# Patient Record
Sex: Male | Born: 1990 | Race: White | Hispanic: No | Marital: Single | Smoking: Never smoker
Health system: Southern US, Community
[De-identification: ages and names within clinical notes are randomized; demographics above are authoritative.]

## PROBLEM LIST (undated history)

## (undated) HISTORY — PX: VASECTOMY: SHX75

---

## 2011-02-11 ENCOUNTER — Inpatient Hospital Stay (HOSPITAL_COMMUNITY)
Admission: EM | Admit: 2011-02-11 | Discharge: 2011-02-14 | DRG: 603 | Disposition: A | Discharge status: Home / Self Care | Payer: PRIVATE HEALTH INSURANCE | Attending: Internal Medicine | Admitting: Internal Medicine

## 2011-02-11 DIAGNOSIS — A4902 Methicillin resistant Staphylococcus aureus infection, unspecified site: Secondary | ICD-10-CM | POA: Diagnosis present

## 2011-02-11 DIAGNOSIS — L02619 Cutaneous abscess of unspecified foot: Principal | ICD-10-CM | POA: Diagnosis present

## 2011-02-11 DIAGNOSIS — J309 Allergic rhinitis, unspecified: Secondary | ICD-10-CM | POA: Diagnosis present

## 2011-02-12 ENCOUNTER — Inpatient Hospital Stay (HOSPITAL_COMMUNITY): Payer: PRIVATE HEALTH INSURANCE

## 2011-02-12 LAB — DIFFERENTIAL
Eosinophils Absolute: 0.1 10*3/uL (ref 0.0–0.7)
Lymphocytes Relative: 46 % (ref 12–46)
Lymphs Abs: 3 10*3/uL (ref 0.7–4.0)
Neutro Abs: 2.7 10*3/uL (ref 1.7–7.7)
Neutrophils Relative %: 42 % — ABNORMAL LOW (ref 43–77)

## 2011-02-12 LAB — BASIC METABOLIC PANEL
CO2: 27 mEq/L (ref 19–32)
Chloride: 100 mEq/L (ref 96–112)
Creatinine, Ser: 0.7 mg/dL (ref 0.4–1.5)

## 2011-02-12 LAB — CBC
MCV: 88.4 fL (ref 78.0–100.0)
Platelets: 245 10*3/uL (ref 150–400)
RBC: 4.98 MIL/uL (ref 4.22–5.81)
WBC: 6.5 10*3/uL (ref 4.0–10.5)

## 2011-02-12 MED ORDER — GADOBENATE DIMEGLUMINE 529 MG/ML IV SOLN: 15.0000 mL | Freq: Once | INTRAVENOUS | Status: AC | PRN

## 2011-02-17 NOTE — Discharge Summary (Signed)
NAMEJACOLBY, [name] NO.:  [id number]  MEDICAL RECORD NO.:  [id number]  LOCATION:  1337                         FACILITY:  [facility]  PHYSICIAN:  Altha Harm, MDDATE OF BIRTH:  1991-05-30  DATE OF ADMISSION:  02/11/2011 DATE OF DISCHARGE:  02/14/2011                              DISCHARGE SUMMARY   DISCHARGE DISPOSITION:  Home.  DISCHARGE DIAGNOSES: 1. Superficial abscess. 2. Methicillin resistant staphylococcus aureus cellulitis. 3. Seasonal allergies.  DISCHARGE MEDICATIONS:  Includes the following: 1. Bactrim DS 2 tablets p.o. b.i.d. x10 days. 2. Vicodin 5/500 one tablet p.o. b.i.d. p.r.n. pain. 3. Loratadine 10 mg p.o. daily. 4. Multivitamin 1 tablet p.o. daily.  CONSULTANT:  Dr. Luretha Murphy, General Surgery.  PROCEDURES:  None.  DIAGNOSTIC STUDIES:  MRI of the left foot which shows superficial soft tissue ulceration with adjacent cellulitis of the anterior aspect of the ankle with no involvement of the underlying tendons, ligaments or osseous structures.  PRIMARY CARE PHYSICIAN:  The patient uses [facility], Dr. Darrin Nipper.  ALLERGIES:  No known drug allergies.  CODE STATUS:  Full code.  CHIEF COMPLAINT:  Swelling of the left foot.  HISTORY OF PRESENT ILLNESS:  Please refer to the H and P by Dr. Nedra Hai for details.  However in short, [name] is a 20 year old gentleman who initially noted a pimple on his leg.  He shaved his leg and then went on a bike ride for 85 miles.  The patient developed cellulitis and was seen at [facility].  At the time of the visit, there was a small abscess which we performed incision and drainage.  The area was cleaned and the patient was discharged with Bactrim and rifampin.  However, the patient's leg worsened over 48 hours and thus the patient presented to the emergency room for further evaluation and treatment.  HOSPITAL COURSE:  Status post I and D abscess with cellulitis of  the left foot.  The patient upon arrival to the emergency room had significant cellulitis and swelling of the foot.  It is clear that oral medications at that time would not penetrate the tissue, thus the patient was started on IV antibiotics with vancomycin and Zosyn. Sensitivities were obtained from the Urgent Care Center which showed that the pathogen was methicillin-resistant staph aureus sensitive to Bactrim among other antibiotics.  The patient received IV antibiotics for 48 hours.  His then was switched over to Bactrim to complete outpatient therapy for total of 10 days.  The patient will follow up with the Urgent Care on Monday for further evaluation and release back to work.  Presently the patient has been instructed to stay off his feet for the next 72 hours allowing the tissue to heel.  He will be reevaluated by Mr. Eloisa Northern at the Urgent Care on Monday and further determination will be made by him going back to work.  The patient has also been given Dr. Molli Hazard Martin's phone number to follow up as needed.  The patient is to do daily wet-to-dry dressing changes q.8 h.  Further instructions about wound care will come from Dr. Luretha Murphy, one of his associates.  PHYSICAL  EXAMINATION:  GENERAL:  At the time of discharge the patient is stable. VITAL SIGNS:  Vital signs are as follows:  Temperature 98, blood pressure 125/62, respiratory rate 20, heart rate 77, O2 sats 100% on room air. HEENT EXAMINATION:  Normocephalic, atraumatic.  Pupils equally round and reactive to light and accommodation.  Extraocular movements are intact. Oropharynx is moist.  No exudate, erythema or lesions are noted. NECK EXAMINATION:  Trachea is midline.  No masses, no thyromegaly, no JVD, no carotid bruit. RESPIRATORY EXAMINATION:  The patient has a normal respiratory effort, equal excursion bilaterally.  No wheezing or rhonchi noted. CARDIOVASCULAR:  He has got a normal S1 and S2.  No murmurs,  rubs, gallops noted. ABDOMEN:  Obese, soft, nontender, nondistended.  No masses, no hepatosplenomegaly. EXTREMITIES:  The lesion on the left foot is about nickel lesion.  The wound bed is red and beefy with good granulation and there is just a very small amount of hydrated necrotic tissue present. PSYCHIATRIC:  He is alert, oriented x3.  Good insight and cognition, recent and remote recall.  FOLLOWUP:  The patient will follow up with [facility] with PA Mr. Eloisa Northern.  The patient also has been given option to follow up with Dr. Daphine Deutscher, the surgeon who saw him in the hospital if he so desires; however, the patient has indicated that he would likely go to the Urgent Care Center since it is close to his house.  In terms of physical restrictions, the patient should be off his feet for the next several days and then limit his time in his feet until the wound starts to scab over but it will be reevaluated on Monday by the PA at the Urgent Care.  Total time for discharge process including face-to-face time approximately 40 minutes.     Altha Harm, MD     MAM/MEDQ  D:  02/14/2011  T:  02/14/2011  Job:  [id number]  cc:   [place] Daphine Deutscher, MD [address] [place] [address]  [place] Sandi Raveling, MD  Electronically Signed by Marthann Schiller MD on 02/17/2011 07:43:51 AM

## 2011-02-28 NOTE — H&P (Signed)
  NAMEELDO, UMANZOR NO.:  [id number]  MEDICAL RECORD NO.:  [id number]  LOCATION:  WLED                         FACILITY:  [facility]  PHYSICIAN:  Houston Siren, MD           DATE OF BIRTH:  03/03/1991  DATE OF ADMISSION:  02/11/2011 DATE OF DISCHARGE:                             HISTORY & PHYSICAL   PRIMARY CARE PHYSICIAN:  None.  ADVANCE DIRECTIVE:  Full code.  REASON FOR ADMISSION:  Left foot cellulitis, failed outpatient therapy.  HISTORY OF PRESENT ILLNESS:  This is a 20 year old [employer] student previously healthy on no chronic medication, presents with cellulitis of the left foot.  A few days ago, he started having a pimple and he tried to drain it with a needle.  He has swelling and pain and subsequently went to the Urgent Care where he had an I and D, and subsequently placed on Bactrim and rifampin.  The swelling progressed and the central necrotic skin has enlarged as well.  He has increased swelling and pain.  He did not recall having any spider bite or any other insect bite. Evaluation in emergency room showed normal white count, and he is afebrile.  Exam shows erythema with swelling with necrotic center. Hospitalist was asked to admit the patient for cellulitis.  PAST MEDICAL HISTORY:  Benign.  MEDICATIONS:  Bactrim and rifampin along with Vicodin for pain.  FAMILY HISTORY:  Noncontributory.  SOCIAL HISTORY:  He denied tobacco, alcohol, or drug use.  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  He is not married and has no children.  ALLERGIES:  No known drug allergies.  PHYSICAL EXAMINATION:  VITAL SIGNS:  Blood pressure 130/80, pulse of 60, respiratory rate 18, temperature 98.6. GENERAL:  Shows alert and oriented, and in no apparent distress. HEENT:  Throat is clear. NECK:  Supple. LUNGS:  Clear. CARDIAC:  With no murmur. ABDOMINAL:  Soft, nontender. EXTREMITIES:  Show swelling and erythema of his left foot with a necrotic center with  ulcers and some necrotic skin, nonfluctuant, and no purulent discharge.  LABORATORY DATA:  White count of 6500, hemoglobin of 15.5, creatinine of 0.7, potassium of 4.1.  IMPRESSION:  This is a 20 year old male previously healthy presented with a pimple that progressed into cellulitis with some necrosis of the center and failed outpatient treatment.  We would treat him with vancomycin and Zosyn.  He might need some superficial debridement.  I would like to give him some pain medication and some IV fluid as well. He is stable and will be admitted to [facility].  If he had not had his tetanus shot, he would need one.     Houston Siren, MD     PL/MEDQ  D:  02/12/2011  T:  02/12/2011  Job:  [id number]  Electronically Signed by Houston Siren  on 02/28/2011 07:23:09 PM

## 2011-03-04 NOTE — Consult Note (Signed)
  NAMEORBIN, MAYEUX NO.:  [id number]  MEDICAL RECORD NO.:  [id number]  LOCATION:  1337                         FACILITY:  [facility]  PHYSICIAN:  Thornton Park. Daphine Deutscher, MD  DATE OF BIRTH:  Apr 13, 1991  DATE OF CONSULTATION:  02/12/2011 DATE OF DISCHARGE:                                CONSULTATION   CHIEF COMPLAINT:  MRSA, open sore on the ventral aspect of the left ankle.  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  He apparently had pimple formed down there and he picked at it last week and then road over the weekend getting area very sweaty and had some real long race or ride.  It then became infected and he was seen at [facility] [address] where they drained it and cultured it and it grew back MRSA.  However, it has gotten to be where it is swollen, red and he came in for IV antibiotics.  Since his admission, his swelling has gone down.  Exam reveals a resultant defect about the size of half dollar.  It has some epidermolysis on the top and some frank purulence draining out. The surrounding areas are not elevated but the edema has largely gone down.  It seems to be adequately drained at the present time and I am going to try Betadine sort of wet to dry gauze dressing to see if this will help to debride this.  If this is unsuccessful, we may need to do more debridement.  Encouragingly he is able to flex and extend his foot and dorsiflex and plantar flex without any kind of limitation or pain. He does report swelling is way down.  He will continue antibiotic therapy IV.  Dr. Ashley Royalty has already ordered MRS.  We can make sure and assess the depth of this infection.  I will follow him with MRS in the hospital.  IMPRESSION:  Methicillin resistant staphylococcus aureus abscess of left lower extremity complicating picking of little minor skin lesion.     Thorn[place] Park Daphine Deutscher,  MD     MBM/MEDQ  D:  02/12/2011  T:  02/12/2011  Job:  [id number]  Electronically Signed by Luretha Murphy MD on 03/04/2011 10:16:44 AM

## 2013-11-20 ENCOUNTER — Emergency Department (INDEPENDENT_AMBULATORY_CARE_PROVIDER_SITE_OTHER)
Admission: EM | Admit: 2013-11-20 | Discharge: 2013-11-20 | Disposition: A | Discharge status: Home / Self Care | Payer: 59 | Source: Home / Self Care | Attending: Emergency Medicine | Admitting: Emergency Medicine

## 2013-11-20 ENCOUNTER — Encounter (HOSPITAL_COMMUNITY): Payer: Self-pay | Admitting: Emergency Medicine

## 2013-11-20 DIAGNOSIS — S61217A Laceration without foreign body of left little finger without damage to nail, initial encounter: Secondary | ICD-10-CM

## 2013-11-20 DIAGNOSIS — S61209A Unspecified open wound of unspecified finger without damage to nail, initial encounter: Secondary | ICD-10-CM

## 2013-11-20 NOTE — Discharge Instructions (Signed)

## 2013-11-20 NOTE — ED Notes (Signed)
Patient states cut left pinky finger today with knife while cutting a onion.

## 2013-11-20 NOTE — ED Provider Notes (Signed)
  Chief Complaint   Chief Complaint  Patient presents with  . Laceration    left "pinky"     History of Present Illness   [name] is a 23 year old male who cut the tip of his left little finger about an hour previously knife while he was cutting some onions. He has a flap laceration the tip of the finger. Bleeding is now controlled. He has good movement and good sensation.  Review of Systems   Other than as noted above, the patient denies any of the following symptoms: Musculoskeletal:  No joint pain or decreased range of motion. Neuro:  No numbness, tingling, or weakness.  PMFSH   Past medical history, family history, social history, meds, and allergies were reviewed. Her last tetanus vaccine was within the past 10 years.  Physical Examination     Vital signs:  BP 143/89  Pulse 85  Temp(Src) 98.7 F (37.1 C) (Oral)  Resp 18  SpO2 100% Ext:  There is a 1 cm flap laceration the tip of the left little finger. Bleeding was controlled. All joints have full range of motion and sensation was intact.  All other joints had a full ROM without pain.  Pulses were full.  Good capillary refill in all digits.  No edema. Neurological:  Alert and oriented.  No muscle weakness.  Sensation was intact to light touch.   Procedure   Verbal informed consent was obtained.  The patient was informed of the risks and benefits of the procedure and understands and accepts.  A time out was called and the identity of the patient and correct procedure were confirmed.   The laceration area described above was prepped with Betadine and saline  and anesthetized with 5 mL of 2% Xylocaine without epinephrine.  The wound was then closed as follows:  The flap was sutured down with 4 5-0 nylon sutures.  There were no immediate complications, and the patient tolerated the procedure well. The laceration was then cleansed, Bacitracin ointment was applied and a clean, dry pressure dressing was put on.   Assessment    The encounter diagnosis was Laceration of left little finger w/o foreign body w/o damage to nail.  Plan   1.  Meds:  The following meds were prescribed:  There are no discharge medications for this patient.   2.  Patient Education/Counseling:  The patient was given appropriate handouts, self care instructions, and instructed in symptomatic relief. Instructions were given for wound care.    3.  Follow up:  The patient was told to follow up immediately if there is any sign of infection.The patient will return in 14 days for suture removal.      Reuben Likesavid C Aarush Stukey, MD 11/20/13 2041

## 2014-02-27 ENCOUNTER — Emergency Department (HOSPITAL_COMMUNITY)
Admission: EM | Admit: 2014-02-27 | Discharge: 2014-02-27 | Discharge status: Absent without leave | Payer: 59 | Source: Home / Self Care

## 2018-10-19 ENCOUNTER — Other Ambulatory Visit: Payer: Self-pay | Admitting: Family Medicine

## 2018-10-19 ENCOUNTER — Ambulatory Visit
Admission: RE | Admit: 2018-10-19 | Discharge: 2018-10-19 | Disposition: A | Discharge status: Home / Self Care | Payer: BLUE CROSS/BLUE SHIELD | Source: Ambulatory Visit | Attending: Family Medicine | Admitting: Family Medicine

## 2018-10-19 DIAGNOSIS — M25561 Pain in right knee: Secondary | ICD-10-CM

## 2019-09-02 DIAGNOSIS — A4902 Methicillin resistant Staphylococcus aureus infection, unspecified site: Secondary | ICD-10-CM

## 2019-09-02 HISTORY — DX: Methicillin resistant Staphylococcus aureus infection, unspecified site: A49.02

## 2019-10-08 IMAGING — CR DG KNEE COMPLETE 4+V*R*
4 series · 4 of 4 positions shown · non-contrast
Comparison: None.

CLINICAL DATA: 27-year-old male with dull tight pain in right knee
when running for the past 2 years. Anterior pain inferior to
patella. No known injury. Initial encounter.

EXAM:
RIGHT KNEE - COMPLETE 4+ VIEW

[w knee ap right]
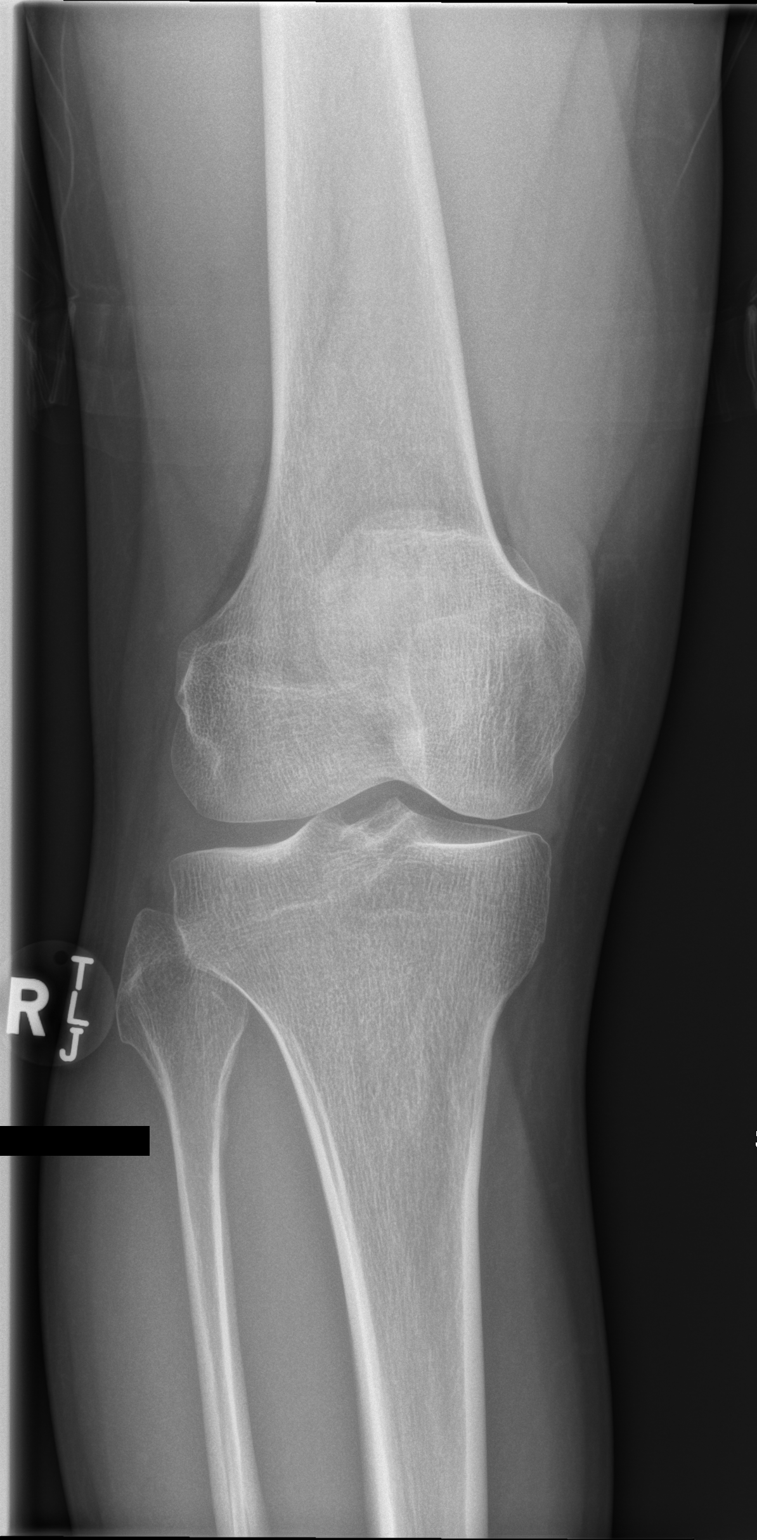

[w knee lat right]
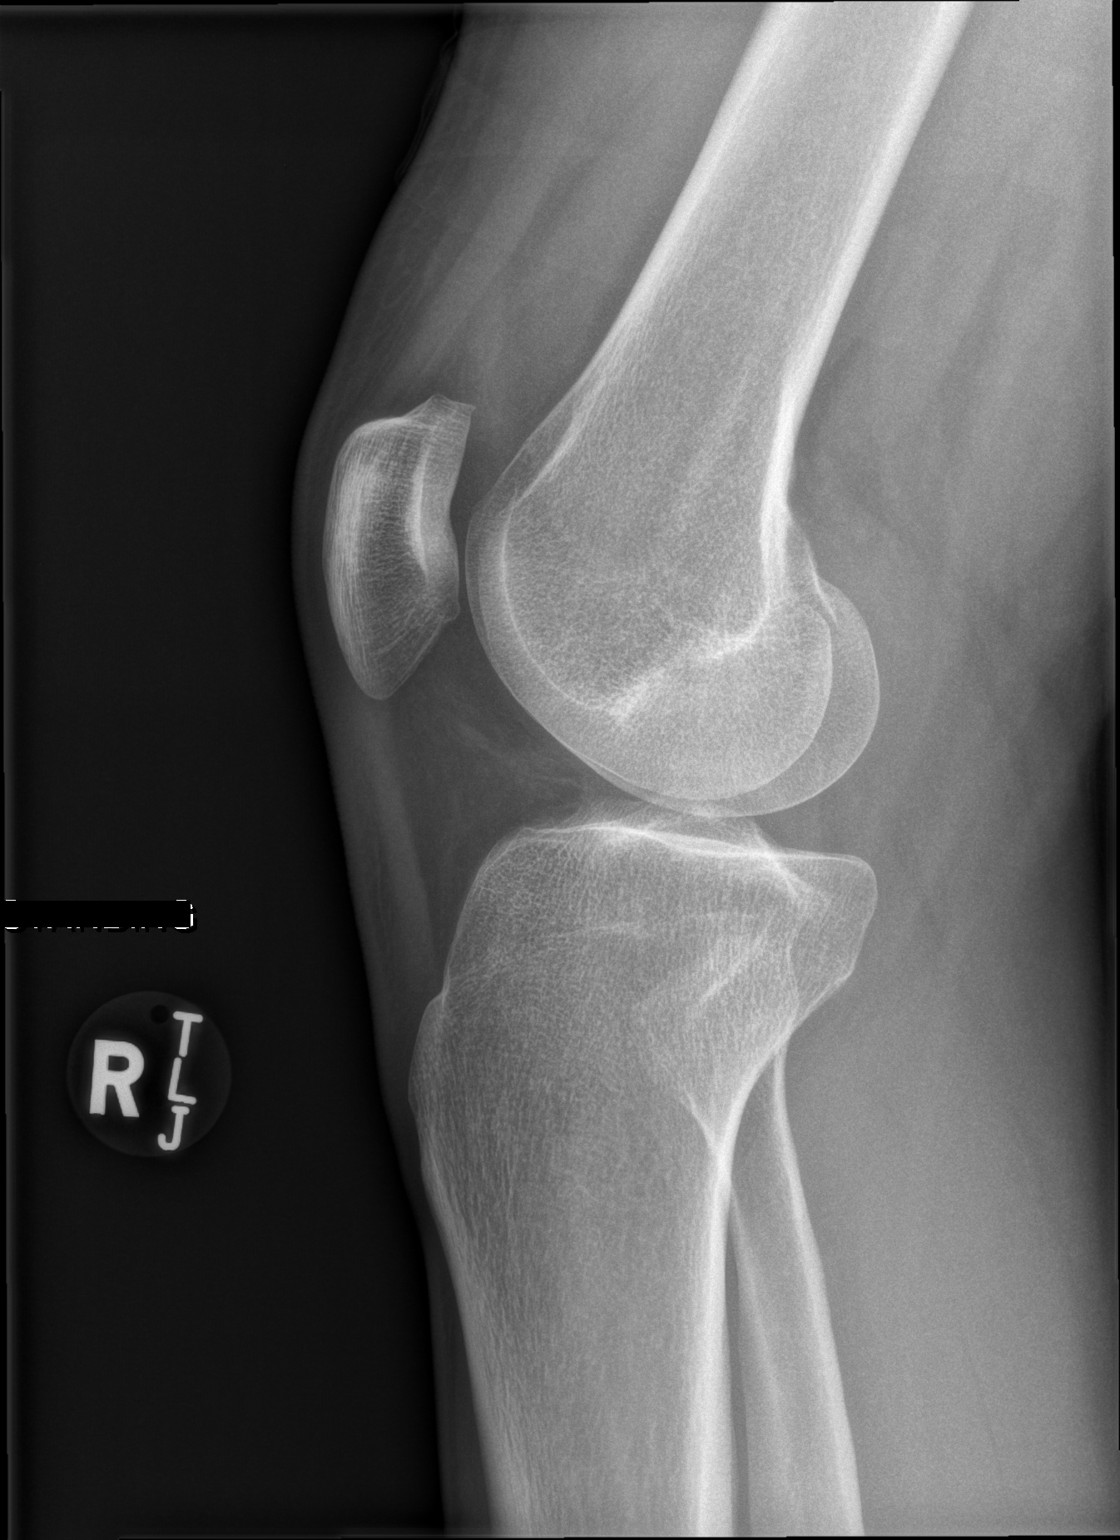

[x knee tunnel right]
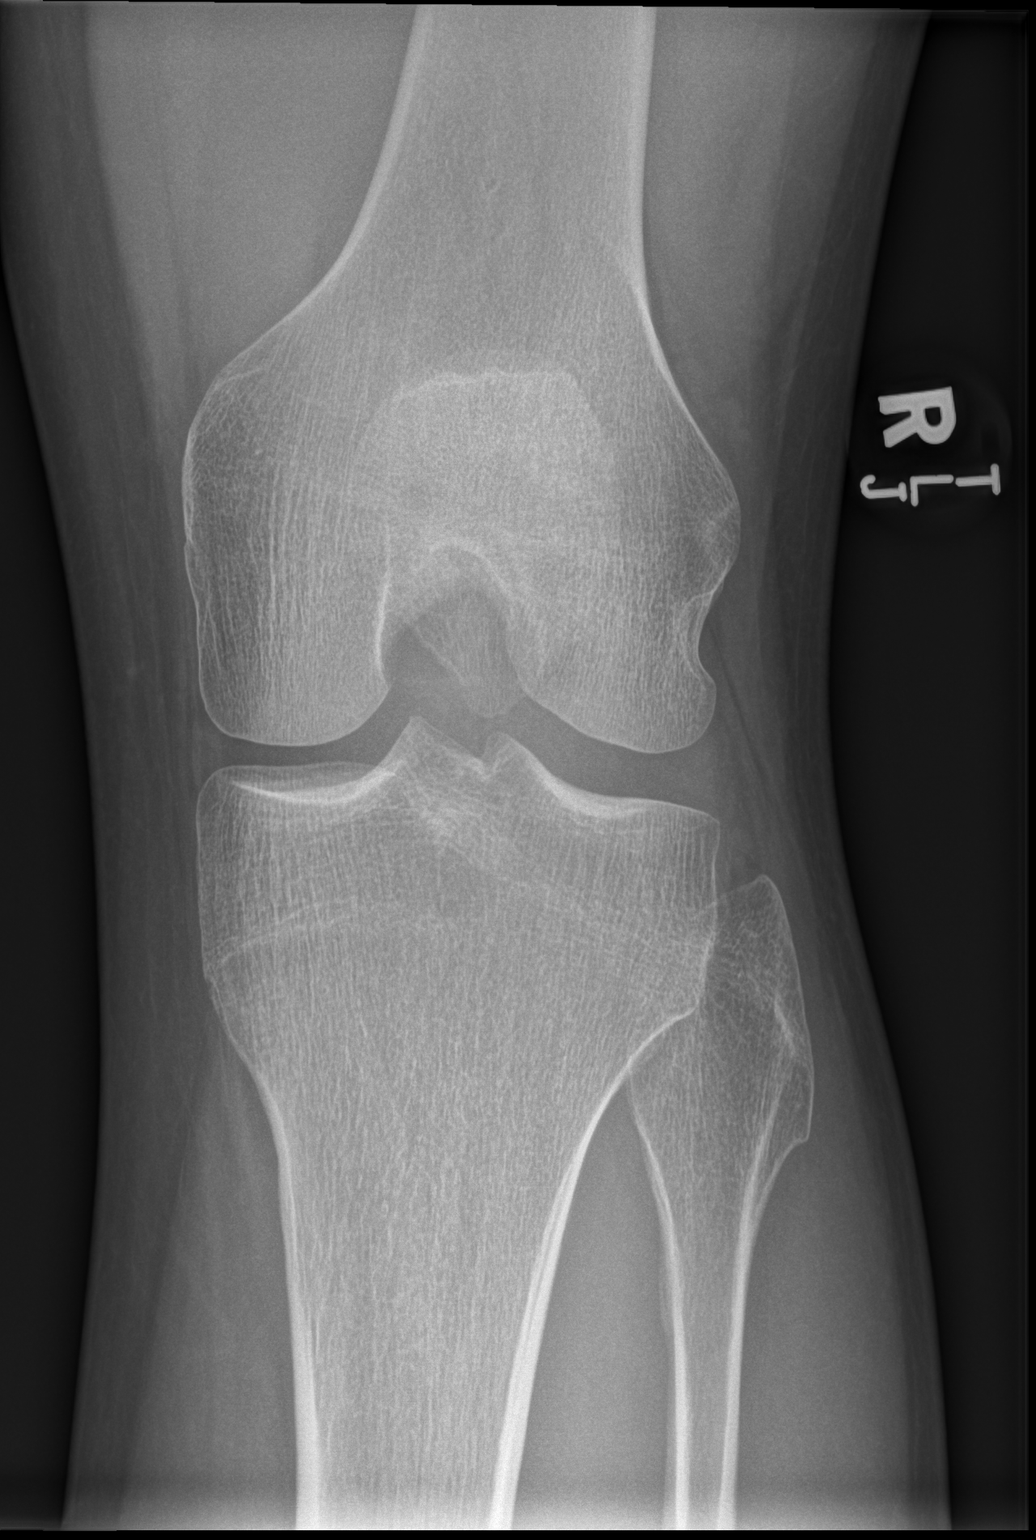

[x knee sunrise right]
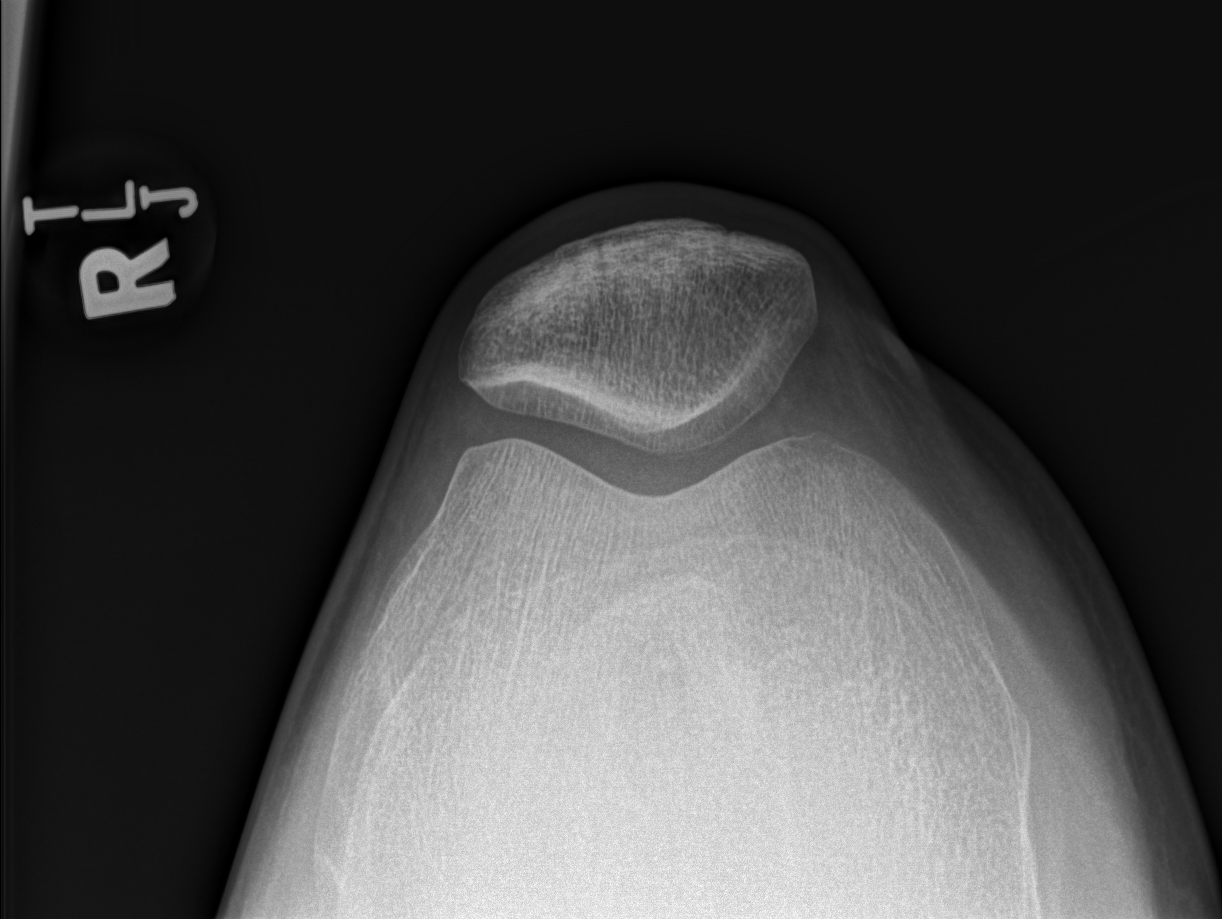

[4 of 4 positions shown; findings below may reference images not displayed]

FINDINGS: Minimal spurring posterior patella may represent minimal
patellofemoral joint degenerative changes. Minimal narrowing medial
tibiofemoral joint space within the range of normal limits. No
fracture or dislocation. No significant joint effusion. No
infiltration of infrapatellar fat pad. Normal caliber distal
quadriceps tendon and patellar tendon.
IMPRESSION: Minimal spurring posterior patella may represent minimal
patellofemoral joint degenerative changes.

## 2023-03-14 ENCOUNTER — Emergency Department (HOSPITAL_COMMUNITY)
Admission: EM | Admit: 2023-03-14 | Discharge: 2023-03-14 | Disposition: A | Discharge status: Home / Self Care | Payer: BC Managed Care – PPO | Attending: Student | Admitting: Student

## 2023-03-14 ENCOUNTER — Emergency Department (HOSPITAL_COMMUNITY): Payer: BC Managed Care – PPO

## 2023-03-14 ENCOUNTER — Encounter (HOSPITAL_COMMUNITY): Payer: Self-pay

## 2023-03-14 ENCOUNTER — Other Ambulatory Visit: Payer: Self-pay

## 2023-03-14 DIAGNOSIS — N50811 Right testicular pain: Secondary | ICD-10-CM | POA: Insufficient documentation

## 2023-03-14 DIAGNOSIS — Z20822 Contact with and (suspected) exposure to covid-19: Secondary | ICD-10-CM | POA: Insufficient documentation

## 2023-03-14 DIAGNOSIS — R6889 Other general symptoms and signs: Secondary | ICD-10-CM | POA: Diagnosis not present

## 2023-03-14 DIAGNOSIS — R39198 Other difficulties with micturition: Secondary | ICD-10-CM | POA: Insufficient documentation

## 2023-03-14 DIAGNOSIS — B349 Viral infection, unspecified: Secondary | ICD-10-CM | POA: Diagnosis not present

## 2023-03-14 DIAGNOSIS — R519 Headache, unspecified: Secondary | ICD-10-CM | POA: Diagnosis not present

## 2023-03-14 DIAGNOSIS — N5089 Other specified disorders of the male genital organs: Secondary | ICD-10-CM | POA: Diagnosis not present

## 2023-03-14 DIAGNOSIS — R509 Fever, unspecified: Secondary | ICD-10-CM | POA: Diagnosis not present

## 2023-03-14 LAB — URINALYSIS, ROUTINE W REFLEX MICROSCOPIC
Bacteria, UA: NONE SEEN
Bilirubin Urine: NEGATIVE
Glucose, UA: NEGATIVE mg/dL
Hgb urine dipstick: NEGATIVE
Ketones, ur: NEGATIVE mg/dL
Leukocytes,Ua: NEGATIVE
Nitrite: NEGATIVE
Protein, ur: 100 mg/dL — AB
Specific Gravity, Urine: 1.02 (ref 1.005–1.030)
pH: 8 (ref 5.0–8.0)

## 2023-03-14 LAB — COMPREHENSIVE METABOLIC PANEL
ALT: 92 U/L — ABNORMAL HIGH (ref 0–44)
AST: 47 U/L — ABNORMAL HIGH (ref 15–41)
Albumin: 3.2 g/dL — ABNORMAL LOW (ref 3.5–5.0)
Alkaline Phosphatase: 86 U/L (ref 38–126)
Anion gap: 13 (ref 5–15)
BUN: 10 mg/dL (ref 6–20)
CO2: 22 mmol/L (ref 22–32)
Calcium: 8.7 mg/dL — ABNORMAL LOW (ref 8.9–10.3)
Chloride: 94 mmol/L — ABNORMAL LOW (ref 98–111)
Creatinine, Ser: 1.05 mg/dL (ref 0.61–1.24)
GFR, Estimated: >60 mL/min (ref >60)
Glucose, Bld: 134 mg/dL — ABNORMAL HIGH (ref 70–99)
Potassium: 3.5 mmol/L (ref 3.5–5.1)
Sodium: 129 mmol/L — ABNORMAL LOW (ref 135–145)
Total Bilirubin: 0.5 mg/dL (ref 0.3–1.2)
Total Protein: 6.7 g/dL (ref 6.5–8.1)

## 2023-03-14 LAB — CBC WITH DIFFERENTIAL/PLATELET
Abs Immature Granulocytes: 0.04 10*3/uL (ref 0.00–0.07)
Basophils Absolute: 0 10*3/uL (ref 0.0–0.1)
Basophils Relative: 0 %
Eosinophils Absolute: 0 10*3/uL (ref 0.0–0.5)
Eosinophils Relative: 0 %
HCT: 37.9 % — ABNORMAL LOW (ref 39.0–52.0)
Hemoglobin: 13.8 g/dL (ref 13.0–17.0)
Immature Granulocytes: 1 %
Lymphocytes Relative: 11 %
Lymphs Abs: 0.9 10*3/uL (ref 0.7–4.0)
MCH: 31.6 pg (ref 26.0–34.0)
MCHC: 36.4 g/dL — ABNORMAL HIGH (ref 30.0–36.0)
MCV: 86.7 fL (ref 80.0–100.0)
Monocytes Absolute: 0.8 10*3/uL (ref 0.1–1.0)
Monocytes Relative: 10 %
Neutro Abs: 6.6 10*3/uL (ref 1.7–7.7)
Neutrophils Relative %: 78 %
Platelets: 209 10*3/uL (ref 150–400)
RBC: 4.37 MIL/uL (ref 4.22–5.81)
RDW: 11.7 % (ref 11.5–15.5)
WBC: 8.5 10*3/uL (ref 4.0–10.5)
nRBC: 0 % (ref 0.0–0.2)

## 2023-03-14 LAB — RESP PANEL BY RT-PCR (RSV, FLU A&B, COVID)  RVPGX2
Influenza A by PCR: NEGATIVE
Influenza B by PCR: NEGATIVE
Resp Syncytial Virus by PCR: NEGATIVE
SARS Coronavirus 2 by RT PCR: NEGATIVE

## 2023-03-14 LAB — HEPATITIS PANEL, ACUTE
HCV Ab: NONREACTIVE
Hep A IgM: NONREACTIVE
Hep B C IgM: NONREACTIVE
Hepatitis B Surface Ag: NONREACTIVE

## 2023-03-14 LAB — MONONUCLEOSIS SCREEN: Mono Screen: NEGATIVE

## 2023-03-14 LAB — GROUP A STREP BY PCR: Group A Strep by PCR: NOT DETECTED

## 2023-03-14 MED ORDER — DIPHENHYDRAMINE HCL 50 MG/ML IJ SOLN
25.0000 mg | Freq: Once | INTRAMUSCULAR | Status: AC
  Administered 2023-03-14: 25 mg via INTRAVENOUS
  Filled 2023-03-14: qty 1

## 2023-03-14 MED ORDER — SODIUM CHLORIDE 0.9 % IV SOLN
2.0000 g | Freq: Once | INTRAVENOUS | Status: AC
  Administered 2023-03-14: 2 g via INTRAVENOUS
  Filled 2023-03-14: qty 20

## 2023-03-14 MED ORDER — KETOROLAC TROMETHAMINE 15 MG/ML IJ SOLN
15.0000 mg | Freq: Once | INTRAMUSCULAR | Status: AC
  Administered 2023-03-14: 15 mg via INTRAVENOUS
  Filled 2023-03-14: qty 1

## 2023-03-14 MED ORDER — ACETAMINOPHEN 500 MG PO TABS
1000.0000 mg | ORAL_TABLET | Freq: Once | ORAL | Status: AC
  Administered 2023-03-14: 1000 mg via ORAL
  Filled 2023-03-14: qty 2

## 2023-03-14 MED ORDER — METOCLOPRAMIDE HCL 5 MG/ML IJ SOLN
10.0000 mg | Freq: Once | INTRAMUSCULAR | Status: AC
  Administered 2023-03-14: 10 mg via INTRAVENOUS
  Filled 2023-03-14: qty 2

## 2023-03-14 MED ORDER — SODIUM CHLORIDE 0.9 % IV BOLUS
1000.0000 mL | Freq: Once | INTRAVENOUS | Status: AC
  Administered 2023-03-14: 1000 mL via INTRAVENOUS

## 2023-03-14 NOTE — Discharge Instructions (Signed)
You are seen today for headache, fever.  While you are here we performed a physical exam, checked your vital signs and monitored you, gave you medications, and performed laboratory exams as well as imaging.  All of these were reassuring and there is no need for any further testing or intervention in the emergency department at this time.  As we discussed, please continue to take your doxycycline.  You should also plan to alternate Tylenol and ibuprofen for your fevers and pain.  You can take up to 1000 mg of Tylenol, and 600 mg of ibuprofen at each dose.  You should alternate these every 3-4 hours.  For instance if you take Tylenol at noon, you should take ibuprofen at 3 PM, and Tylenol again at 6 PM.   Please follow up with a physician 48 hours from now. Our transition of care team may contact you to help schedule it. You can also go to an urgent care or return to the ED if need be.   Thanks for allowing [place] to take care of you today.

## 2023-03-14 NOTE — ED Provider Notes (Signed)
[place] EMERGENCY DEPARTMENT AT [facility] Provider Note   CSN: [id number] Arrival date & time: 03/14/23  1237     History  Chief Complaint  Patient presents with   Headache   Generalized Body Aches    Kristoffer Skold is a 32 y.o. male, pertinent past medical history, who presents to the ED secondary to headache, fever, chills, fatigue for the last week.  Per patient, about a week ago he started feeling very unwell, and has progressively gotten worse.  He states that he has a severe headache that will not go away no matter what he takes.  He states that he has a fever as well, and he went to urgent care and was evaluated they drew labs as well as a COVID and flu, and his COVID flu was negative, gave him a pain shot, and now he has some neck pain after the pain shot.  He has photophobia, phonophobia, and some associated nausea.  Has had a little bit of a runny nose, but no cough, and slight sore throat.  He denies being outside, around many ticks, but states they do have dogs and [place] and the dogs bring the ticks in.  Was tested for [facility] spotted fever, and Lyme disease by urgent care earlier.  States he just cannot feel better.  Does note that he has had some difficulty urinating, and some right testicular pain.  He is only sexually active with his wife.  Home Medications Prior to Admission medications   Not on File      Allergies    Patient has no known allergies.    Review of Systems   Review of Systems  Constitutional:  Positive for fever.  Respiratory:  Negative for cough.   Neurological:  Positive for headaches.    Physical Exam Updated Vital Signs BP (!) 151/74 (BP Location: Right Arm)   Pulse (!) 102   Temp 100.3 F (37.9 C) (Oral)   Resp 20   Ht 5\' 9"  (1.753 m)   Wt 83.9 kg   SpO2 100%   BMI 27.32 kg/m  Physical Exam Vitals and nursing note reviewed.  Constitutional:      Appearance: He is well-developed.  HENT:     Head:  Normocephalic and atraumatic.  Eyes:     Conjunctiva/sclera: Conjunctivae normal.  Neck:     Comments: No nuchal rigidity Cardiovascular:     Rate and Rhythm: Regular rhythm. Tachycardia present.     Heart sounds: No murmur heard. Pulmonary:     Effort: Pulmonary effort is normal. No respiratory distress.     Breath sounds: Normal breath sounds.  Abdominal:     Palpations: Abdomen is soft.     Tenderness: There is no abdominal tenderness.  Genitourinary:    Testes:        Right: Mass or swelling not present.        Left: Mass or swelling not present.     Comments: +ttp of right epididymal head w/o fluctuance or erythema. Musculoskeletal:        General: No swelling.     Cervical back: Neck supple.  Skin:    General: Skin is warm and dry.     Capillary Refill: Capillary refill takes less than 2 seconds.  Neurological:     Mental Status: He is alert.  Psychiatric:        Mood and Affect: Mood normal.     ED Results / Procedures / Treatments  Labs (all labs ordered are listed, but only abnormal results are displayed) Labs Reviewed  RESP PANEL BY RT-PCR (RSV, FLU A&B, COVID)  RVPGX2  GROUP A STREP BY PCR  CBC WITH DIFFERENTIAL/PLATELET  COMPREHENSIVE METABOLIC PANEL  MONONUCLEOSIS SCREEN  URINALYSIS, ROUTINE W REFLEX MICROSCOPIC    EKG None  Radiology No results found.  Procedures Procedures    Medications Ordered in ED Medications  ketorolac (TORADOL) 15 MG/ML injection 15 mg (has no administration in time range)  metoCLOPramide (REGLAN) injection 10 mg (has no administration in time range)  diphenhydrAMINE (BENADRYL) injection 25 mg (has no administration in time range)  sodium chloride 0.9 % bolus 1,000 mL (has no administration in time range)    ED Course/ Medical Decision Making/ A&P                             Medical Decision Making Patient is a 33 year old male, here for fever, chills, body aches, headache for the last week, had take testing  outpatient, which she just started, he is febrile, tachycardic, and not well-appearing.  Will given fluids, Toradol, Reglan, Benadryl, given that he has a headache with a fever, we will give him a head CT, he has no nuchal rigidity however, and is able to have complete range of motion of his neck.  Although he is ill-appearing he is not toxic appearing.  Additionally complaining of testicular pain, tenderness to palpation of epididymal head, we will obtain ultrasound for further evaluation as well as urinalysis, he is only sexually active with his wife, and I am not concerned for STDs, thus we will not obtained a gonorrhea chlamydia at this time.  Handed off to Dr. Willeen Cass for further disposition/evaluation  Amount and/or Complexity of Data Reviewed Labs: ordered. Radiology: ordered.  Risk Prescription drug management.   Final Clinical Impression(s) / ED Diagnoses Final diagnoses:  None    Rx / DC Orders ED Discharge Orders     None         Annalynn Centanni, Harley Alto, PA 03/14/23 1509    Glendora Score, MD 03/14/23 1859

## 2023-03-14 NOTE — ED Provider Notes (Signed)
Patient care assumed from previous provider.   Patient care of [name] is a 32 y.o. male from previous provider. Please see the original provider note from this emergency department encounter for full history and physical.   Course of Care and my assessment at the time of sign out is detailed in the ED Course below.  Briefly, the patient presents after being sick for several days, almost a week.  Reports he was on vacation in [place] and came back on July 2, symptoms started around July 5 with a headache.  Progressively got worse, and then he started having fevers and chills.  Has been extensively worked up prior to my assumption of care.  Clinical Course as of 03/14/23 2144  Sat Mar 14, 2023  1551 Mono Screen: NEGATIVE [BB]  1610 Had a long discussion with the patient about discharge and engaged in shared decision making.  I am reassured that he is capable of close follow-up and has good social support at home. [BB]    Clinical Course User Index [BB] Fayrene Helper, MD   I discussed the plan for discharge with the patient and/or their surrogate at bedside prior to discharge and they were in agreement with the plan and verbalized understanding of the return precautions provided. All questions answered to the best of my ability. Ultimately, the patient was discharged in stable condition with stable vital signs. I am reassured that they are capable of close follow up and good social support at home.   Labs reviewed by myself and considered in medical decision making.  Imaging reviewed by myself and considered in medical decision making. Imaging final read interpreted by radiology.  1. Viral syndrome    The plan for this patient was discussed with Dr. No att. providers found, who voiced agreement and who oversaw evaluation and treatment of this patient.     Fayrene Helper, MD 03/14/23 9604    Glyn Ade, MD 03/14/23 2255

## 2023-03-14 NOTE — ED Notes (Signed)
Pt remains a&ox4, warm and dry to touch. Pt's headache has decreased and so has his fever. Pt states that he feels much better. He is able to to use urinal at the bedside without issues.

## 2023-03-14 NOTE — ED Triage Notes (Signed)
Pt arrived POV from home c/o headache, body aches and fever x7 days. Pt was seen at urgent care today and was negative for flu and covid but pt cannot take the pain.

## 2023-04-22 ENCOUNTER — Encounter: Payer: Self-pay | Admitting: Family Medicine

## 2023-04-22 ENCOUNTER — Ambulatory Visit (INDEPENDENT_AMBULATORY_CARE_PROVIDER_SITE_OTHER): Payer: BC Managed Care – PPO | Admitting: Family Medicine

## 2023-04-22 VITALS — BP 122/69 | HR 82 | Temp 98.0°F | Ht 69.0 in | Wt 179.0 lb

## 2023-04-22 DIAGNOSIS — Z0001 Encounter for general adult medical examination with abnormal findings: Secondary | ICD-10-CM

## 2023-04-22 DIAGNOSIS — Z131 Encounter for screening for diabetes mellitus: Secondary | ICD-10-CM | POA: Diagnosis not present

## 2023-04-22 DIAGNOSIS — R509 Fever, unspecified: Secondary | ICD-10-CM

## 2023-04-22 DIAGNOSIS — Z13220 Encounter for screening for lipoid disorders: Secondary | ICD-10-CM

## 2023-04-22 LAB — COMPREHENSIVE METABOLIC PANEL
ALT: 21 U/L (ref 0–53)
AST: 22 U/L (ref 0–37)
Albumin: 4.6 g/dL (ref 3.5–5.2)
Alkaline Phosphatase: 56 U/L (ref 39–117)
BUN: 14 mg/dL (ref 6–23)
CO2: 28 mEq/L (ref 19–32)
Calcium: 9.7 mg/dL (ref 8.4–10.5)
Chloride: 103 mEq/L (ref 96–112)
Creatinine, Ser: 0.95 mg/dL (ref 0.40–1.50)
GFR: 104.55 mL/min (ref >60.00)
Glucose, Bld: 98 mg/dL (ref 70–99)
Potassium: 4.1 meq/L (ref 3.5–5.1)
Sodium: 138 meq/L (ref 135–145)
Total Bilirubin: 1.1 mg/dL (ref 0.2–1.2)
Total Protein: 7.2 g/dL (ref 6.0–8.3)

## 2023-04-22 LAB — CBC
HCT: 46.1 % (ref 39.0–52.0)
Hemoglobin: 15.9 g/dL (ref 13.0–17.0)
MCHC: 34.4 g/dL (ref 30.0–36.0)
MCV: 89.2 fl (ref 78.0–100.0)
Platelets: 266 10*3/uL (ref 150.0–400.0)
RBC: 5.16 Mil/uL (ref 4.22–5.81)
RDW: 13.6 % (ref 11.5–15.5)
WBC: 4 10*3/uL (ref 4.0–10.5)

## 2023-04-22 LAB — LIPID PANEL
Cholesterol: 151 mg/dL (ref 0–200)
HDL: 49 mg/dL (ref >39.00)
LDL Cholesterol: 85 mg/dL (ref 0–99)
NonHDL: 101.67
Total CHOL/HDL Ratio: 3
Triglycerides: 82 mg/dL (ref 0.0–149.0)
VLDL: 16.4 mg/dL (ref 0.0–40.0)

## 2023-04-22 LAB — HEMOGLOBIN A1C: Hgb A1c MFr Bld: 4.8 % (ref 4.6–6.5)

## 2023-04-22 NOTE — Progress Notes (Signed)
Chief Complaint:  [name] is a 32 y.o. male who presents today for his annual comprehensive physical exam.    Assessment/Plan:  Febrile Illness Symptoms have resolved however concern for possible tickborne illness based on his constellation of symptoms and lack of alternative diagnosis.  He did have Lyme disease and RMSF titers drawn initially however discussed with patient that these titers usually are negative for the first 4 to 6 weeks after illness.  We will recheck both of these today.  Also recheck other labs including CBC and c-Met.  It is possible that all of this may have been due to a viral illness.  He will let us know if he has any recurrence of symptoms.  Testicular pain Resolving.  Does have a history of prior vasectomy which could be contributing.  Likely related to his above febrile illness.  Had a scrotal ultrasound in the ED which showed only mild swelling of the epididymis without any other masses or concerning findings.  Discussed with patient if he has recurrence he can try taking ibuprofen or naproxen as needed however this becomes more of a persistent recurrent issue will need to get him back into see urology.  Preventative Healthcare: Check labs.  Patient Counseling(The following topics were reviewed and/or handout was given):  -Nutrition: Stressed importance of moderation in sodium/caffeine intake, saturated fat and cholesterol, caloric balance, sufficient intake of fresh fruits, vegetables, and fiber.  -Stressed the importance of regular exercise.   -Substance Abuse: Discussed cessation/primary prevention of tobacco, alcohol, or other drug use; driving or other dangerous activities under the influence; availability of treatment for abuse.   -Injury prevention: Discussed safety belts, safety helmets, smoke detector, smoking near bedding or upholstery.   -Sexuality: Discussed sexually transmitted diseases, partner selection, use of condoms, avoidance of unintended  pregnancy and contraceptive alternatives.   -Dental health: Discussed importance of regular tooth brushing, flossing, and dental visits.  -Health maintenance and immunizations reviewed. Please refer to Health maintenance section.  Return to care in 1 year for next preventative visit.     Subjective:  HPI:  Patient is here to establish care.  He is a new patient.  He has no acute concerns today though is concerned about an episode last month where he had a week of fever, chills, myalgias, headache, and testicular pain.  Ended up going to urgent care and then subsequently routed to the ED for this.  Had extensive workup there including labs and imaging which was all negative for specific cause.  He did end up getting a testicular ultrasound which did show epididymal swelling concern for possible epididymitis and he was given 2 g of Rocephin in the ED.  Symptoms did gradually improve with this however a week or so ago had a recurrence of testicular pain.  This has since resolved.  Unclear etiology for his initial illness.  He was diagnosed with a viral illness in the ED.  Lifestyle Diet: Balanced. Plenty of fruits and vegetables.  Exercise: Lifts weights daily. Working on some cardio     04/22/2023    7:24 AM  Depression screen PHQ 2/9  Decreased Interest 0  Down, Depressed, Hopeless 0  PHQ - 2 Score 0    Health Maintenance Due  Topic Date Due   HIV Screening  Never done   DTaP/Tdap/Td (1 - Tdap) Never done   COVID-19 Vaccine (1 - 2023-24 season) Never done     ROS: Per HPI, otherwise a complete review of systems was negative.  PMH:  The following were reviewed and entered/updated in epic: History reviewed. No pertinent past medical history. There are no problems to display for this patient.  History reviewed. No pertinent surgical history.  Family History  Problem Relation Age of Onset   Multiple sclerosis Mother    Parkinson's disease Paternal Grandmother      Medications- reviewed and updated No current outpatient medications on file.   No current facility-administered medications for this visit.    Allergies-reviewed and updated No Known Allergies  Social History   Socioeconomic History   Marital status: Single    Spouse name: Not on file   Number of children: Not on file   Years of education: Not on file   Highest education level: Not on file  Occupational History   Not on file  Tobacco Use   Smoking status: Never   Smokeless tobacco: Never  Substance and Sexual Activity   Alcohol use: Yes    Comment: Occasional/Social   Drug use: No   Sexual activity: Not on file  Other Topics Concern   Not on file  Social History Narrative   Not on file   Social Determinants of Health   Financial Resource Strain: Not on file  Food Insecurity: Not on file  Transportation Needs: Not on file  Physical Activity: Not on file  Stress: Not on file  Social Connections: Not on file        Objective:  Physical Exam: BP 122/69   Pulse 82   Temp 98 F (36.7 C) (Temporal)   Ht 5\' 9"  (1.753 m)   Wt 179 lb (81.2 kg)   SpO2 98%   BMI 26.43 kg/m   Body mass index is 26.43 kg/m. Wt Readings from Last 3 Encounters:  04/22/23 179 lb (81.2 kg)  03/14/23 185 lb (83.9 kg)   Gen: NAD, resting comfortably HEENT: TMs normal bilaterally. OP clear. No thyromegaly noted.  CV: RRR with no murmurs appreciated Pulm: NWOB, CTAB with no crackles, wheezes, or rhonchi GI: Normal bowel sounds present. Soft, Nontender, Nondistended. MSK: no edema, cyanosis, or clubbing noted Skin: warm, dry Neuro: CN2-12 grossly intact. Strength 5/5 in upper and lower extremities. Reflexes symmetric and intact bilaterally.  Psych: Normal affect and thought content     Tichina Koebel M. Jimmey Ralph, MD 04/22/2023 7:26 AM

## 2023-04-22 NOTE — Patient Instructions (Addendum)
It was very nice to see you today!  We will check blood work today.   It is possible the illness you had several weeks ago was due to a viral illness however I want to make sure that it was not due to a tickborne illness.  Please keep up the great work your diet and exercise.  Please let us know if your testicular pain persist.  Return in about 1 year (around 04/21/2024) for Annual Physical.   Take care, Dr Jimmey Ralph  PLEASE NOTE:  If you had any lab tests, please let us know if you have not heard back within a few days. You may see your results on mychart before we have a chance to review them but we will give you a call once they are reviewed by [place].   If we ordered any referrals today, please let us know if you have not heard from their office within the next week.   If you had any urgent prescriptions sent in today, please check with the pharmacy within an hour of our visit to make sure the prescription was transmitted appropriately.   Please try these tips to maintain a healthy lifestyle:  Eat at least 3 REAL meals and 1-2 snacks per day.  Aim for no more than 5 hours between eating.  If you eat breakfast, please do so within one hour of getting up.   Each meal should contain half fruits/vegetables, one quarter protein, and one quarter carbs (no bigger than a computer mouse)  Cut down on sweet beverages. This includes juice, soda, and sweet tea.   Drink at least 1 glass of water with each meal and aim for at least 8 glasses per day  Exercise at least 150 minutes every week.    Preventive Care 68-33 Years Old, Male Preventive care refers to lifestyle choices and visits with your health care provider that can promote health and wellness. Preventive care visits are also called wellness exams. What can I expect for my preventive care visit? Counseling During your preventive care visit, your health care provider may ask about your: Medical history, including: Past medical  problems. Family medical history. Current health, including: Emotional well-being. Home life and relationship well-being. Sexual activity. Lifestyle, including: Alcohol, nicotine or tobacco, and drug use. Access to firearms. Diet, exercise, and sleep habits. Safety issues such as seatbelt and bike helmet use. Sunscreen use. Work and work Astronomer. Physical exam Your health care provider may check your: Height and weight. These may be used to calculate your BMI (body mass index). BMI is a measurement that tells if you are at a healthy weight. Waist circumference. This measures the distance around your waistline. This measurement also tells if you are at a healthy weight and may help predict your risk of certain diseases, such as type 2 diabetes and high blood pressure. Heart rate and blood pressure. Body temperature. Skin for abnormal spots. What immunizations do I need?  Vaccines are usually given at various ages, according to a schedule. Your health care provider will recommend vaccines for you based on your age, medical history, and lifestyle or other factors, such as travel or where you work. What tests do I need? Screening Your health care provider may recommend screening tests for certain conditions. This may include: Lipid and cholesterol levels. Diabetes screening. This is done by checking your blood sugar (glucose) after you have not eaten for a while (fasting). Hepatitis B test. Hepatitis C test. HIV (human immunodeficiency virus) test. STI (  sexually transmitted infection) testing, if you are at risk. Talk with your health care provider about your test results, treatment options, and if necessary, the need for more tests. Follow these instructions at home: Eating and drinking  Eat a healthy diet that includes fresh fruits and vegetables, whole grains, lean protein, and low-fat dairy products. Drink enough fluid to keep your urine pale yellow. Take vitamin and mineral  supplements as recommended by your health care provider. Do not drink alcohol if your health care provider tells you not to drink. If you drink alcohol: Limit how much you have to 0-2 drinks a day. Know how much alcohol is in your drink. In the [place] one drink equals one 12 oz bottle of beer (355 mL), one 5 oz glass of wine (148 mL), or one 1 oz glass of hard liquor (44 mL). Lifestyle Brush your teeth every morning and night with fluoride toothpaste. Floss one time each day. Exercise for at least 30 minutes 5 or more days each week. Do not use any products that contain nicotine or tobacco. These products include cigarettes, chewing tobacco, and vaping devices, such as e-cigarettes. If you need help quitting, ask your health care provider. Do not use drugs. If you are sexually active, practice safe sex. Use a condom or other form of protection to prevent STIs. Find healthy ways to manage stress, such as: Meditation, yoga, or listening to music. Journaling. Talking to a trusted person. Spending time with friends and family. Minimize exposure to UV radiation to reduce your risk of skin cancer. Safety Always wear your seat belt while driving or riding in a vehicle. Do not drive: If you have been drinking alcohol. Do not ride with someone who has been drinking. If you have been using any mind-altering substances or drugs. While texting. When you are tired or distracted. Wear a helmet and other protective equipment during sports activities. If you have firearms in your house, make sure you follow all gun safety procedures. Seek help if you have been physically or sexually abused. What's next? Go to your health care provider once a year for an annual wellness visit. Ask your health care provider how often you should have your eyes and teeth checked. Stay up to date on all vaccines. This information is not intended to replace advice given to you by your health care provider. Make sure you  discuss any questions you have with your health care provider. Document Revised: 02/13/2021 Document Reviewed: 02/13/2021 Elsevier Patient Education  2024 [employer].

## 2023-04-24 LAB — TSH: TSH: 1.14 u[IU]/mL (ref 0.35–5.50)

## 2023-04-25 LAB — ROCKY MTN SPOTTED FVR ABS PNL(IGG+IGM)
RMSF IgG: NOT DETECTED
RMSF IgM: NOT DETECTED

## 2023-04-25 LAB — B. BURGDORFI ANTIBODIES: B burgdorferi Ab IgG+IgM: <0.9 {index}

## 2023-04-28 NOTE — Progress Notes (Signed)
Great news! his labs are all normal.  No signs of tickborne illness!  His liver numbers and electrolytes are back to normal.  His blood sugar and lipid panel is at goal.  Do not need to make any changes to treatment plan.  He should continue the great work with diet and exercise and we can recheck everything or so in a few years.

## 2024-02-10 ENCOUNTER — Ambulatory Visit
Admission: RE | Admit: 2024-02-10 | Discharge: 2024-02-10 | Disposition: A | Discharge status: Home / Self Care | Source: Ambulatory Visit | Attending: Nurse Practitioner | Admitting: Nurse Practitioner

## 2024-02-10 ENCOUNTER — Other Ambulatory Visit: Payer: Self-pay | Admitting: Nurse Practitioner

## 2024-02-10 DIAGNOSIS — Z021 Encounter for pre-employment examination: Secondary | ICD-10-CM

## 2024-04-22 ENCOUNTER — Encounter: Payer: BC Managed Care – PPO | Admitting: Family Medicine
